# Patient Record
Sex: Male | Born: 1995 | Race: White | Hispanic: No | Marital: Single | Smoking: Former smoker
Health system: Southern US, Community
[De-identification: ages and names within clinical notes are randomized; demographics above are authoritative.]

---
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
## 2015-01-05 ENCOUNTER — Ambulatory Visit: Payer: Self-pay | Admitting: Unknown Physician Specialty

## 2018-03-01 ENCOUNTER — Encounter: Payer: Self-pay | Admitting: Emergency Medicine

## 2018-03-01 ENCOUNTER — Ambulatory Visit
Admission: EM | Admit: 2018-03-01 | Discharge: 2018-03-01 | Disposition: A | Discharge status: Home / Self Care | Payer: BC Managed Care – PPO | Attending: Family Medicine | Admitting: Family Medicine

## 2018-03-01 ENCOUNTER — Other Ambulatory Visit: Payer: Self-pay

## 2018-03-01 DIAGNOSIS — R0981 Nasal congestion: Secondary | ICD-10-CM | POA: Diagnosis not present

## 2018-03-01 DIAGNOSIS — J3489 Other specified disorders of nose and nasal sinuses: Secondary | ICD-10-CM

## 2018-03-01 DIAGNOSIS — J0110 Acute frontal sinusitis, unspecified: Secondary | ICD-10-CM | POA: Diagnosis not present

## 2018-03-01 DIAGNOSIS — J029 Acute pharyngitis, unspecified: Secondary | ICD-10-CM

## 2018-03-01 LAB — RAPID STREP SCREEN (MED CTR MEBANE ONLY): Streptococcus, Group A Screen (Direct): NEGATIVE

## 2018-03-01 MED ORDER — FLUTICASONE PROPIONATE 50 MCG/ACT NA SUSP: 1.0000 | Freq: Every day | NASAL | 2 refills | Status: AC

## 2018-03-01 MED ORDER — AZITHROMYCIN 250 MG PO TABS: ORAL_TABLET | ORAL | 0 refills | Status: AC

## 2018-03-01 MED ORDER — CETIRIZINE HCL 10 MG PO TABS: 10.0000 mg | ORAL_TABLET | Freq: Every day | ORAL | 0 refills | Status: AC

## 2018-03-01 NOTE — Discharge Instructions (Signed)
-  Strep swab was negative, will send for culture. Sore throat likely related to nasal/sinus drainage.  -Flonase-one spray each nostril daily -Zyrtec: one tablet at night -Aztithromycin: can start as prescribed if no improvement in symptoms in 2-3 days.  -nasal sinus rinse with Nettie Pot or nasal saline spray as attached. -fluids -ibuprofen or Tylenol for pain

## 2018-03-01 NOTE — ED Triage Notes (Signed)
Patient in today stating he woke up this morning with a sore throat, headache and felt feverish (didn't take temperature). Patient states his friend was recently diagnosed with strep.

## 2018-03-01 NOTE — ED Provider Notes (Signed)
[facility]    CSN: [id number] Arrival date & time: 03/01/18  1125     History   Chief Complaint Chief Complaint  Patient presents with  . Sore Throat    HPI Jacob Blake is a 22 y.o. male.   Patient is a 22 year old male who presents with complaint of sore throat that was first noticed when he woke up this morning.  Patient states he felt feverish yesterday and today but did not take his temperature.  He states he has a friend that was diagnosed with strep over the weekend.  Patient states he did have several episodes of strep while he was in high school.  Patient is not taking over-the-counter meds at home for his symptoms.  Patient reports some sinus congestion in his forehead with some phlegm production.  Patient denies cough or body aches.  He does report some chills in his hands and his feet.  Patient denies any other sick contacts.  Patient denies any chest pain, shortness of breath, or abdominal discomfort.  He did report that he did vomit up a little bit of water this morning.  Patient denies any drug allergies.  Patient states he does not take any daily antihistamines.      History reviewed. No pertinent past medical history.  There are no active problems to display for this patient.   History reviewed. No pertinent surgical history.     Home Medications    Prior to Admission medications   Medication Sig Start Date End Date Taking? Authorizing Provider  azithromycin (ZITHROMAX Z-PAK) 250 MG tablet Take 2 tablets by mouth the first day followed by one tablet daily for next 4 days. 03/01/18   Candis SchatzHarris, Allie Gerhold D, PA-C  cetirizine (ZYRTEC) 10 MG tablet Take 1 tablet (10 mg total) by mouth daily. 03/01/18   Candis SchatzHarris, Kivon Aprea D, PA-C  fluticasone (FLONASE) 50 MCG/ACT nasal spray Place 1 spray into both nostrils daily. 03/01/18   Candis SchatzHarris, Treniece Holsclaw D, PA-C    Family History Family History  Problem Relation Age of Onset  . Asthma Mother   . Skin cancer Father      Social History Social History   Tobacco Use  . Smoking status: Former Smoker    Last attempt to quit: 03/01/2017    Years since quitting: 1.0  . Smokeless tobacco: Never Used  Substance Use Topics  . Alcohol use: No    Frequency: Never  . Drug use: No     Allergies   Patient has no known allergies.   Review of Systems Review of Systems  As noted in HPI.  Other systems reviewed and found to be negative   Physical Exam Triage Vital Signs ED Triage Vitals [03/01/18 1147]  Enc Vitals Group     BP 123/81     Pulse Rate (!) 59     Resp 16     Temp (!) 97.5 F (36.4 C)     Temp Source Oral     SpO2 99 %     Weight 145 lb (65.8 kg)     Height 5\' 11"  (1.803 m)     Head Circumference      Peak Flow      Pain Score 3     Pain Loc      Pain Edu?      Excl. in GC?    No data found.  Updated Vital Signs BP 123/81 (BP Location: Left Arm)   Pulse (!) 59   Temp (!)  97.5 F (36.4 C) (Oral)   Resp 16   Ht 5\' 11"  (1.803 m)   Wt 145 lb (65.8 kg)   SpO2 99%   BMI 20.22 kg/m   Physical Exam  Constitutional: He is oriented to person, place, and time. He appears well-developed and well-nourished. He does not appear ill.  HENT:  Right Ear: Tympanic membrane normal. No middle ear effusion.  Left Ear: Tympanic membrane normal.  No middle ear effusion.  Nose: Right sinus exhibits frontal sinus tenderness. Right sinus exhibits no maxillary sinus tenderness. Left sinus exhibits frontal sinus tenderness. Left sinus exhibits no maxillary sinus tenderness.  Mouth/Throat: Uvula is midline and oropharynx is clear and moist. No uvula swelling. No posterior oropharyngeal edema or tonsillar abscesses.  Some clear postnasal drainage.  Eyes: EOM are normal. Pupils are equal, round, and reactive to light.  Neck: Normal range of motion.  Cardiovascular: Normal rate and regular rhythm.  No murmur heard. Pulmonary/Chest: Effort normal. No respiratory distress. He has no wheezes.    Abdominal: Soft.  Lymphadenopathy:    He has no cervical adenopathy.  Neurological: He is alert and oriented to person, place, and time.  Skin: Skin is warm and dry.     UC Treatments / Results  Labs (all labs ordered are listed, but only abnormal results are displayed) Labs Reviewed  RAPID STREP SCREEN (NOT AT [facility])  CULTURE, GROUP A STREP [facility])    EKG  EKG Interpretation None       Radiology No results found.  Procedures Procedures (including critical care time)  Medications Ordered in UC Medications - No data to display   Initial Impression / Assessment and Plan / UC Course  I have reviewed the triage vital signs and the nursing notes.  Pertinent labs & imaging results that were available during my care of the patient were reviewed by me and considered in my medical decision making (see chart for details).    Patient with sore throat that started this morning.  Rapid strep was negative.  Some sinus tenderness to the front.  Some postnasal drainage noted.  Recommend fluids, given prescription for Flonase and Zyrtec.  Given sinus tenderness also go ahead and give him a prescription for azithromycin they can start in 2-3 days if no improvement.  Will have patient follow with primary care provider.  Final Clinical Impressions(s) / UC Diagnoses   Final diagnoses:  Sore throat  Nasal congestion  Frontal sinus pain  Acute frontal sinusitis, recurrence not specified    ED Discharge Orders        Ordered    cetirizine (ZYRTEC) 10 MG tablet  Daily     03/01/18 1203    fluticasone (FLONASE) 50 MCG/ACT nasal spray  Daily     03/01/18 1204    azithromycin (ZITHROMAX Z-PAK) 250 MG tablet     03/01/18 1208       Controlled Substance Prescriptions [place] Controlled Substance Registry consulted? Not Applicable   Candis Schatz, PA-C 03/01/18 1209

## 2018-03-04 LAB — CULTURE, GROUP A STREP (THRC)
